# Patient Record
Sex: Male | Born: 1950 | Race: Black or African American | Hispanic: No | Marital: Married | Smoking: Never smoker
Health system: Southern US, Community
[De-identification: ages and names within clinical notes are randomized; demographics above are authoritative.]

## PROBLEM LIST (undated history)

## (undated) DIAGNOSIS — R011 Cardiac murmur, unspecified: Secondary | ICD-10-CM

## (undated) DIAGNOSIS — A159 Respiratory tuberculosis unspecified: Secondary | ICD-10-CM

## (undated) DIAGNOSIS — E119 Type 2 diabetes mellitus without complications: Secondary | ICD-10-CM

## (undated) DIAGNOSIS — I10 Essential (primary) hypertension: Secondary | ICD-10-CM

## (undated) HISTORY — DX: Respiratory tuberculosis unspecified: A15.9

## (undated) HISTORY — DX: Cardiac murmur, unspecified: R01.1

## (undated) HISTORY — DX: Type 2 diabetes mellitus without complications: E11.9

## (undated) HISTORY — DX: Essential (primary) hypertension: I10

---

## 1992-12-26 HISTORY — PX: HEMORRHOID SURGERY: SHX153

## 2002-07-24 ENCOUNTER — Emergency Department (HOSPITAL_COMMUNITY): Admission: EM | Admit: 2002-07-24 | Discharge: 2002-07-24 | Payer: Self-pay | Admitting: Emergency Medicine

## 2005-01-11 ENCOUNTER — Emergency Department (HOSPITAL_COMMUNITY): Admission: EM | Admit: 2005-01-11 | Discharge: 2005-01-11 | Payer: Self-pay | Admitting: Emergency Medicine

## 2005-01-15 ENCOUNTER — Emergency Department (HOSPITAL_COMMUNITY): Admission: EM | Admit: 2005-01-15 | Discharge: 2005-01-15 | Payer: Self-pay | Admitting: Emergency Medicine

## 2014-08-10 ENCOUNTER — Ambulatory Visit (INDEPENDENT_AMBULATORY_CARE_PROVIDER_SITE_OTHER): Payer: Non-veteran care | Admitting: Family Medicine

## 2014-08-10 VITALS — BP 162/82 | HR 78 | Temp 98.2°F | Resp 16 | Ht 67.0 in | Wt 211.2 lb

## 2014-08-10 DIAGNOSIS — H5711 Ocular pain, right eye: Secondary | ICD-10-CM

## 2014-08-10 DIAGNOSIS — S0502XA Injury of conjunctiva and corneal abrasion without foreign body, left eye, initial encounter: Secondary | ICD-10-CM

## 2014-08-10 DIAGNOSIS — H5710 Ocular pain, unspecified eye: Secondary | ICD-10-CM

## 2014-08-10 DIAGNOSIS — S058X9A Other injuries of unspecified eye and orbit, initial encounter: Secondary | ICD-10-CM

## 2014-08-10 NOTE — Progress Notes (Signed)
**Note Paul-Identified via Obfuscation**    Subjective:    Patient ID: Paul Clements, male    DOB: May 01, 1951, 63 y.o.   MRN: [id number] This chart was scribed for Paul SidleKurt Tiernan Suto, MD by Chestine SporeSoijett Blue, ED Scribe. The patient was seen in room 12 at 12:56 PM.   Chief Complaint  Patient presents with  . Eye Drainage    clear drainage, poked in eye with friends star earring, ensuring it is not infected    HPI  HPI Comments: Paul NurseRufus E Venable is a 63 y.o. male with a medical hx of HTN, DM, Tuberculosis, and heart murmur who presents today complaining of eye drainage onset last night. He states that the drainage is clear. He states that he was hugging a friend and her earring poked his eye. He states that he wants to have his eye checked out to make sure that it is not infected. He states that last night his eye was burning. He denies any other associated symptoms. He states that he put a wash-cloth on his eye with warm water running over it. He states that he works with the [employer]. He denies being allergic to any medications. He states that he drove himself here today.   There are no active problems to display for this patient.  Past Medical History  Diagnosis Date  . Diabetes mellitus without complication   . Heart murmur   . Hypertension   . Tuberculosis    Past Surgical History  Procedure Laterality Date  . Hemorrhoid surgery  1994   No Known Allergies Prior to Admission medications   Medication Sig Start Date End Date Taking? Authorizing Provider  aspirin 81 MG tablet Take 81 mg by mouth daily.   Yes Historical Provider, MD     Review of Systems  Eyes: Positive for discharge.       Objective:   Physical Exam  Nursing note and vitals reviewed. Constitutional: He is oriented to person, place, and time. He appears well-developed and well-nourished. No distress.  HENT:  Head: Normocephalic and atraumatic.  Eyes: EOM are normal.  Slit lamp exam:      The left eye shows corneal abrasion.  1/4 inch by 3/8 inch  corneal abrasion on the left eye.   Neck: Neck supple. No tracheal deviation present.  Cardiovascular: Normal rate.   Pulmonary/Chest: Effort normal. No respiratory distress.  Musculoskeletal: Normal range of motion.  Neurological: He is alert and oriented to person, place, and time.  Skin: Skin is warm and dry.  Psychiatric: He has a normal mood and affect. His behavior is normal.    Left eye injected laterally with full EOM, PERLA     BP 162/82  Pulse 78  Temp(Src) 98.2 F (36.8 C) (Oral)  Resp 16  Ht 5\' 7"  (1.702 m)  Wt 211 lb 3.2 oz (95.8 kg)  BMI 33.07 kg/m2  SpO2 100%  Assessment & Plan:  DIAGNOSTIC STUDIES: Oxygen Saturation is 100% on room air, normal by my interpretation.    COORDINATION OF CARE: 1:05 PM-Discussed treatment plan with pt at bedside and pt agreed to plan. Drops were given in the office for the pt; Tobrex and prednisolone.   1. Corneal abrasion, left, initial encounter     I personally performed the services described in this documentation, which was scribed in my presence. The recorded information has been reviewed and is accurate.  Corneal abrasion, left, initial encounter  Eye pain, right  Signed, Paul SidleKurt Reina Wilton, MD

## 2014-08-10 NOTE — Patient Instructions (Signed)
Corneal Abrasion °The cornea is the clear covering at the front and center of the eye. When looking at the colored portion of the eye (iris), you are looking through the cornea. This very thin tissue is made up of many layers. The surface layer is a single layer of cells (corneal epithelium) and is one of the most sensitive tissues in the body. If a scratch or injury causes the corneal epithelium to come off, it is called a corneal abrasion. If the injury extends to the tissues below the epithelium, the condition is called a corneal ulcer. °CAUSES  °· Scratches. °· Trauma. °· Foreign body in the eye. °Some people have recurrences of abrasions in the area of the original injury even after it has healed (recurrent erosion syndrome). Recurrent erosion syndrome generally improves and goes away with time. °SYMPTOMS  °· Eye pain. °· Difficulty or inability to keep the injured eye open. °· The eye becomes very sensitive to light. °· Recurrent erosions tend to happen suddenly, first thing in the morning, usually after waking up and opening the eye. °DIAGNOSIS  °Your health care provider can diagnose a corneal abrasion during an eye exam. Dye is usually placed in the eye using a drop or a small paper strip moistened by your tears. When the eye is examined with a special light, the abrasion shows up clearly because of the dye. °TREATMENT  °· Small abrasions may be treated with antibiotic drops or ointment alone. °· A pressure patch may be put over the eye. If this is done, follow your doctor's instructions for when to remove the patch. Do not drive or use machines while the eye patch is on. Judging distances is hard to do with a patch on. °If the abrasion becomes infected and spreads to the deeper tissues of the cornea, a corneal ulcer can result. This is serious because it can cause corneal scarring. Corneal scars interfere with light passing through the cornea and cause a loss of vision in the involved eye. °HOME CARE  INSTRUCTIONS °· Use medicine or ointment as directed. Only take over-the-counter or prescription medicines for pain, discomfort, or fever as directed by your health care provider. °· Do not drive or operate machinery if your eye is patched. Your ability to judge distances is impaired. °· If your health care provider has given you a follow-up appointment, it is very important to keep that appointment. Not keeping the appointment could result in a severe eye infection or permanent loss of vision. If there is any problem keeping the appointment, let your health care provider know. °SEEK MEDICAL CARE IF:  °· You have pain, light sensitivity, and a scratchy feeling in one eye or both eyes. °· Your pressure patch keeps loosening up, and you can blink your eye under the patch after treatment. °· Any kind of discharge develops from the eye after treatment or if the lids stick together in the morning. °· You have the same symptoms in the morning as you did with the original abrasion days, weeks, or months after the abrasion healed. °MAKE SURE YOU:  °· Understand these instructions. °· Will watch your condition. °· Will get help right away if you are not doing well or get worse. °Document Released: 12/09/2000 Document Revised: 12/17/2013 Document Reviewed: 08/19/2013 °ExitCare® Patient Information ©2015 [employer]. This information is not intended to replace advice given to you by your health care provider. Make sure you discuss any questions you have with your health care provider. ° °

## 2014-11-21 ENCOUNTER — Ambulatory Visit (INDEPENDENT_AMBULATORY_CARE_PROVIDER_SITE_OTHER): Payer: Non-veteran care | Admitting: Emergency Medicine

## 2014-11-21 VITALS — BP 162/86 | HR 70 | Temp 98.0°F | Resp 18 | Ht 67.5 in | Wt 211.2 lb

## 2014-11-21 DIAGNOSIS — R1084 Generalized abdominal pain: Secondary | ICD-10-CM

## 2014-11-21 LAB — POCT CBC
Granulocyte percent: 70.4 %G (ref 37–80)
HCT, POC: 38 % — AB (ref 43.5–53.7)
Hemoglobin: 11.3 g/dL — AB (ref 14.1–18.1)
LYMPH, POC: 1.9 (ref 0.6–3.4)
MCH, POC: 23 pg — AB (ref 27–31.2)
MCHC: 29.7 g/dL — AB (ref 31.8–35.4)
MCV: 77.3 fL — AB (ref 80–97)
MID (cbc): 0.1 (ref 0–0.9)
MPV: 8.6 fL (ref 0–99.8)
PLATELET COUNT, POC: 149 10*3/uL (ref 142–424)
POC Granulocyte: 4.7 (ref 2–6.9)
POC LYMPH PERCENT: 27.7 %L (ref 10–50)
POC MID %: 1.9 % (ref 0–12)
RBC: 4.92 M/uL (ref 4.69–6.13)
RDW, POC: 15.1 %
WBC: 6.7 10*3/uL (ref 4.6–10.2)

## 2014-11-21 MED ORDER — ONDANSETRON 8 MG PO TBDP: 8.0000 mg | ORAL_TABLET | Freq: Three times a day (TID) | ORAL | Status: DC | PRN

## 2014-11-21 MED ORDER — LOPERAMIDE HCL 2 MG PO TABS: ORAL_TABLET | ORAL | Status: DC

## 2014-11-21 NOTE — Progress Notes (Signed)
**Note Paul-Identified via Obfuscation** [facility] [address], [place] [id number] [phone]  Date:  11/21/2014   Name:  Paul NurseRufus E Caradine   DOB:  1951/04/21   MRN:  [id number]  PCP:  No PCP Per Patient    Chief Complaint: Abdominal Pain and Nausea   History of Present Illness:  Paul Clements is a 63 y.o. very pleasant male patient who presents with the following:  Ate thanksgiving dinner out of town yesterday.  Since arising today has experienced crampy abdominal pain and frequent stools No diarrhea.  No vomiting but has diaphoresis and nausea at times with bowel movement. No fever or chills.  The patient has no complaint of blood, mucous, or pus in her stools. Says has generalized abdominal discomfort. No GU symptoms No ill contacts No improvement with over the counter medications or other home remedies.  Denies other complaint or health concern today.   There are no active problems to display for this patient.   Past Medical History  Diagnosis Date  . Diabetes mellitus without complication   . Heart murmur   . Hypertension   . Tuberculosis     Past Surgical History  Procedure Laterality Date  . Hemorrhoid surgery  1994    History  Substance Use Topics  . Smoking status: Never Smoker   . Smokeless tobacco: Never Used  . Alcohol Use: Yes    Family History  Problem Relation Age of Onset  . Diabetes Mother   . Diabetes Brother   . Hypertension Brother     No Known Allergies  Medication list has been reviewed and updated.  Current Outpatient Prescriptions on File Prior to Visit  Medication Sig Dispense Refill  . aspirin 81 MG tablet Take 81 mg by mouth daily.     No current facility-administered medications on file prior to visit.    Review of Systems:  As per HPI, otherwise negative.    Physical Examination: Filed Vitals:   11/21/14 1742  BP: 162/86  Pulse: 70  Temp: 98 F (36.7 C)  Resp: 18   Filed Vitals:   11/21/14 1742  Height: 5' 7.5" (1.715 m)   Weight: 211 lb 3.2 oz (95.8 kg)   Body mass index is 32.57 kg/(m^2). Ideal Body Weight: Weight in (lb) to have BMI = 25: 161.7  GEN: WDWN, NAD, Non-toxic, A & O x 3 HEENT: Atraumatic, Normocephalic. Neck supple. No masses, No LAD. Ears and Nose: No external deformity. CV: RRR, No M/G/R. No JVD. No thrill. No extra heart sounds. PULM: CTA B, no wheezes, crackles, rhonchi. No retractions. No resp. distress. No accessory muscle use. ABD: S, NT, ND, +BS. No rebound. No HSM. EXTR: No c/c/e NEURO Normal gait.  PSYCH: Normally interactive. Conversant. Not depressed or anxious appearing.  Calm demeanor.    Assessment and Plan: Colicky abdominal pain Anemia Hemoccult Follow up with FMD   Signed,  Phillips OdorJeffery Kirstine Jacquin, MD   Results for orders placed or performed in visit on 11/21/14  POCT CBC  Result Value Ref Range   WBC 6.7 4.6 - 10.2 K/uL   Lymph, poc 1.9 0.6 - 3.4   POC LYMPH PERCENT 27.7 10 - 50 %L   MID (cbc) 0.1 0 - 0.9   POC MID % 1.9 0 - 12 %M   POC Granulocyte 4.7 2 - 6.9   Granulocyte percent 70.4 37 - 80 %G   RBC 4.92 4.69 - 6.13 M/uL   Hemoglobin 11.3 (A) 14.1 - 18.1 g/dL  HCT, POC 38.0 (A) 43.5 - 53.7 %   MCV 77.3 (A) 80 - 97 fL   MCH, POC 23.0 (A) 27 - 31.2 pg   MCHC 29.7 (A) 31.8 - 35.4 g/dL   RDW, POC 65.715.1 %   Platelet Count, POC 149 142 - 424 K/uL   MPV 8.6 0 - 99.8 fL

## 2018-03-15 ENCOUNTER — Ambulatory Visit (HOSPITAL_COMMUNITY)
Admission: EM | Admit: 2018-03-15 | Discharge: 2018-03-15 | Disposition: A | Discharge status: Home / Self Care | Payer: Self-pay | Attending: Family Medicine | Admitting: Family Medicine

## 2018-03-15 ENCOUNTER — Encounter (HOSPITAL_COMMUNITY): Payer: Self-pay | Admitting: Emergency Medicine

## 2018-03-15 ENCOUNTER — Other Ambulatory Visit: Payer: Self-pay

## 2018-03-15 DIAGNOSIS — M79605 Pain in left leg: Secondary | ICD-10-CM

## 2018-03-15 MED ORDER — DICLOFENAC SODIUM 75 MG PO TBEC: 75.0000 mg | DELAYED_RELEASE_TABLET | Freq: Two times a day (BID) | ORAL | 0 refills | Status: DC

## 2018-03-15 NOTE — ED Triage Notes (Signed)
C/o left leg pain, no known injury

## 2018-03-15 NOTE — ED Notes (Signed)
Patient discharged by provider.

## 2018-03-19 NOTE — ED Provider Notes (Signed)
[facility]   [id number] 03/15/18 Arrival Time: 1702  ASSESSMENT & PLAN:  1. Left leg pain    Meds ordered this encounter  Medications  . diclofenac (VOLTAREN) 75 MG EC tablet    Sig: Take 1 tablet (75 mg total) by mouth 2 (two) times daily.    Dispense:  14 tablet    Refill:  0   Trial of NSAID. [facility]  Reviewed expectations re: course of current medical issues. Questions answered. Outlined signs and symptoms indicating need for more acute intervention. Patient verbalized understanding. After Visit Summary given.  SUBJECTIVE: History from: patient. Paul Clements is a 67 y.o. male who reports generalized LLE discomfort. On/off for a few days. No injury/trauma. Ambulatory without difficulty. When present, describes as an "ache... or soreness." No OTC analgesics needed. May last a few minutes, sometimes up to an hour. No extremity sensation changes or weakness. No specific aggravating or alleviating factors reported. No h/o similar.  ROS: As per HPI.   OBJECTIVE:  Vitals:   03/15/18 1727  BP: 140/62  Pulse: 66  Temp: 97.9 F (36.6 C)  TempSrc: Oral  SpO2: 100%    General appearance: alert; no distress Extremities: no cyanosis or edema; symmetrical with no gross deformities; LLE exam completely normal CV: normal extremity capillary refill Skin: warm and dry Neurologic: normal gait; normal symmetric reflexes in all extremities; normal sensation in all extremities Psychological: alert and cooperative; normal mood and affect  No Known Allergies  Past Medical History:  Diagnosis Date  . Diabetes mellitus without complication (HCC)   . Heart murmur   . Hypertension   . Tuberculosis    Social History   Socioeconomic History  . Marital status: Married    Spouse name: Not on file  . Number of children: Not on file  . Years of education: Not on file  . Highest education level: Not on file  Occupational History  . Not on file    Social Needs  . Financial resource strain: Not on file  . Food insecurity:    Worry: Not on file    Inability: Not on file  . Transportation needs:    Medical: Not on file    Non-medical: Not on file  Tobacco Use  . Smoking status: Never Smoker  . Smokeless tobacco: Never Used  Substance and Sexual Activity  . Alcohol use: Yes  . Drug use: No  . Sexual activity: Not on file  Lifestyle  . Physical activity:    Days per week: Not on file    Minutes per session: Not on file  . Stress: Not on file  Relationships  . Social connections:    Talks on phone: Not on file    Gets together: Not on file    Attends religious service: Not on file    Active member of club or organization: Not on file    Attends meetings of clubs or organizations: Not on file    Relationship status: Not on file  . Intimate partner violence:    Fear of current or ex partner: Not on file    Emotionally abused: Not on file    Physically abused: Not on file    Forced sexual activity: Not on file  Other Topics Concern  . Not on file  Social History Narrative  . Not on file   Family History  Problem Relation Age of Onset  . Diabetes Mother   . Diabetes  Brother   . Hypertension Brother    Past Surgical History:  Procedure Laterality Date  . HEMORRHOID SURGERY  1994      Mardella LaymanHagler, Reynalda Canny, MD 03/20/18 1005

## 2020-04-12 ENCOUNTER — Emergency Department (HOSPITAL_COMMUNITY): Payer: Medicare Other

## 2020-04-12 ENCOUNTER — Encounter (HOSPITAL_COMMUNITY): Payer: Self-pay | Admitting: Emergency Medicine

## 2020-04-12 ENCOUNTER — Emergency Department (HOSPITAL_COMMUNITY)
Admission: EM | Admit: 2020-04-12 | Discharge: 2020-04-12 | Disposition: A | Discharge status: Home / Self Care | Payer: Medicare Other | Attending: Emergency Medicine | Admitting: Emergency Medicine

## 2020-04-12 ENCOUNTER — Other Ambulatory Visit: Payer: Self-pay

## 2020-04-12 DIAGNOSIS — I10 Essential (primary) hypertension: Secondary | ICD-10-CM | POA: Diagnosis not present

## 2020-04-12 DIAGNOSIS — Z7982 Long term (current) use of aspirin: Secondary | ICD-10-CM | POA: Insufficient documentation

## 2020-04-12 DIAGNOSIS — K8020 Calculus of gallbladder without cholecystitis without obstruction: Secondary | ICD-10-CM | POA: Insufficient documentation

## 2020-04-12 DIAGNOSIS — E119 Type 2 diabetes mellitus without complications: Secondary | ICD-10-CM | POA: Diagnosis not present

## 2020-04-12 DIAGNOSIS — R109 Unspecified abdominal pain: Secondary | ICD-10-CM | POA: Diagnosis present

## 2020-04-12 DIAGNOSIS — Z79899 Other long term (current) drug therapy: Secondary | ICD-10-CM | POA: Insufficient documentation

## 2020-04-12 LAB — CBC WITH DIFFERENTIAL/PLATELET
Abs Immature Granulocytes: 0.03 10*3/uL (ref 0.00–0.07)
Basophils Absolute: 0 10*3/uL (ref 0.0–0.1)
Basophils Relative: 0 %
Eosinophils Absolute: 0 10*3/uL (ref 0.0–0.5)
Eosinophils Relative: 0 %
HCT: 46 % (ref 39.0–52.0)
Hemoglobin: 13.9 g/dL (ref 13.0–17.0)
Immature Granulocytes: 0 %
Lymphocytes Relative: 14 %
Lymphs Abs: 1.3 10*3/uL (ref 0.7–4.0)
MCH: 24 pg — ABNORMAL LOW (ref 26.0–34.0)
MCHC: 30.2 g/dL (ref 30.0–36.0)
MCV: 79.3 fL — ABNORMAL LOW (ref 80.0–100.0)
Monocytes Absolute: 0.2 10*3/uL (ref 0.1–1.0)
Monocytes Relative: 2 %
Neutro Abs: 7.5 10*3/uL (ref 1.7–7.7)
Neutrophils Relative %: 84 %
Platelets: 160 10*3/uL (ref 150–400)
RBC: 5.8 MIL/uL (ref 4.22–5.81)
RDW: 14.4 % (ref 11.5–15.5)
WBC: 9.1 10*3/uL (ref 4.0–10.5)
nRBC: 0 % (ref 0.0–0.2)

## 2020-04-12 LAB — COMPREHENSIVE METABOLIC PANEL
ALT: 15 U/L (ref 0–44)
AST: 24 U/L (ref 15–41)
Albumin: 4.3 g/dL (ref 3.5–5.0)
Alkaline Phosphatase: 77 U/L (ref 38–126)
Anion gap: 11 (ref 5–15)
BUN: 13 mg/dL (ref 8–23)
CO2: 24 mmol/L (ref 22–32)
Calcium: 9.4 mg/dL (ref 8.9–10.3)
Chloride: 101 mmol/L (ref 98–111)
Creatinine, Ser: 0.98 mg/dL (ref 0.61–1.24)
GFR calc Af Amer: >60 mL/min (ref >60)
GFR calc non Af Amer: >60 mL/min (ref >60)
Glucose, Bld: 254 mg/dL — ABNORMAL HIGH (ref 70–99)
Potassium: 4.4 mmol/L (ref 3.5–5.1)
Sodium: 136 mmol/L (ref 135–145)
Total Bilirubin: 1.4 mg/dL — ABNORMAL HIGH (ref 0.3–1.2)
Total Protein: 8.7 g/dL — ABNORMAL HIGH (ref 6.5–8.1)

## 2020-04-12 LAB — URINALYSIS, ROUTINE W REFLEX MICROSCOPIC
Bacteria, UA: NONE SEEN
Bilirubin Urine: NEGATIVE
Glucose, UA: >=500 mg/dL — AB
Hgb urine dipstick: NEGATIVE
Ketones, ur: 20 mg/dL — AB
Leukocytes,Ua: NEGATIVE
Nitrite: NEGATIVE
Protein, ur: 100 mg/dL — AB
Specific Gravity, Urine: 1.016 (ref 1.005–1.030)
pH: 7 (ref 5.0–8.0)

## 2020-04-12 LAB — LIPASE, BLOOD: Lipase: 29 U/L (ref 11–51)

## 2020-04-12 MED ORDER — ONDANSETRON 4 MG PO TBDP: 4.0000 mg | ORAL_TABLET | Freq: Three times a day (TID) | ORAL | 0 refills | Status: AC | PRN

## 2020-04-12 MED ORDER — ONDANSETRON 4 MG PO TBDP
4.0000 mg | ORAL_TABLET | Freq: Once | ORAL | Status: AC
  Administered 2020-04-12: 4 mg via ORAL
  Filled 2020-04-12: qty 1

## 2020-04-12 MED ORDER — OXYCODONE-ACETAMINOPHEN 5-325 MG PO TABS: 1.0000 | ORAL_TABLET | ORAL | 0 refills | Status: AC | PRN

## 2020-04-12 MED ORDER — HYDROMORPHONE HCL 1 MG/ML IJ SOLN
1.0000 mg | Freq: Once | INTRAMUSCULAR | Status: AC
  Administered 2020-04-12: 1 mg via INTRAVENOUS
  Filled 2020-04-12: qty 1

## 2020-04-12 MED ORDER — SODIUM CHLORIDE 0.9 % IV BOLUS
500.0000 mL | Freq: Once | INTRAVENOUS | Status: AC
  Administered 2020-04-12: 500 mL via INTRAVENOUS

## 2020-04-12 MED ORDER — FENTANYL CITRATE (PF) 100 MCG/2ML IJ SOLN
50.0000 ug | Freq: Once | INTRAMUSCULAR | Status: AC
  Administered 2020-04-12: 50 ug via INTRAVENOUS
  Filled 2020-04-12: qty 2

## 2020-04-12 MED ORDER — OXYCODONE-ACETAMINOPHEN 5-325 MG PO TABS
1.0000 | ORAL_TABLET | Freq: Once | ORAL | Status: AC
  Administered 2020-04-12: 1 via ORAL
  Filled 2020-04-12: qty 1

## 2020-04-12 NOTE — ED Provider Notes (Signed)
[facility] DEPT Provider Note   CSN: [id number] Arrival date & time: 04/12/20  1638     History Chief Complaint  Patient presents with  . Flank Pain    Paul Clements is a 69 y.o. male history of obesity, diabetes, hypertension, tuberculosis.  Patient presents today for right-sided flank pain onset 3 days ago, no clear inciting event.  Describes a waxing and waning throbbing sensation to his right side, no clear aggravating or alleviating factors, currently moderate in intensity.  Denies similar pain in the past.  Denies fever/chills, fall/injury, headache, chest pain/shortness of breath, pleurisy, nausea/vomiting, diarrhea, dysuria/hematuria, testicular pain/swelling, numbness/tingling, weakness or any additional concerns. HPI     Past Medical History:  Diagnosis Date  . Diabetes mellitus without complication ([place])   . Heart murmur   . Hypertension   . Tuberculosis     There are no problems to display for this patient.   Past Surgical History:  Procedure Laterality Date  . HEMORRHOID SURGERY  1994       Family History  Problem Relation Age of Onset  . Diabetes Mother   . Diabetes Brother   . Hypertension Brother     Social History   Tobacco Use  . Smoking status: Never Smoker  . Smokeless tobacco: Never Used  Substance Use Topics  . Alcohol use: Yes  . Drug use: No    Home Medications Prior to Admission medications   Medication Sig Start Date End Date Taking? Authorizing Provider  aspirin 81 MG tablet Take 81 mg by mouth daily.    [provider]  atorvastatin (LIPITOR) 40 MG tablet Take 40 mg by mouth daily.    [provider]  carvedilol (COREG) 12.5 MG tablet Take 12.5 mg by mouth 2 (two) times daily with a meal.    [provider]  diclofenac (VOLTAREN) 75 MG EC tablet Take 1 tablet (75 mg total) by mouth 2 (two) times daily. 03/15/18   Vanessa Kick, MD    Allergies    Patient has no known  allergies.  Review of Systems   Review of Systems Ten systems are reviewed and are negative for acute change except as noted in the HPI  Physical Exam Updated Vital Signs There were no vitals taken for this visit.  Physical Exam Constitutional:      General: He is not in acute distress.    Appearance: Normal appearance. He is well-developed. He is obese. He is not ill-appearing or diaphoretic.  HENT:     Head: Normocephalic and atraumatic.     Right Ear: External ear normal.     Left Ear: External ear normal.     Nose: Nose normal.  Eyes:     General: Vision grossly intact. Gaze aligned appropriately.     Pupils: Pupils are equal, round, and reactive to light.  Neck:     Trachea: Trachea and phonation normal. No tracheal deviation.  Pulmonary:     Effort: Pulmonary effort is normal. No respiratory distress.  Abdominal:     General: There is no distension.     Palpations: Abdomen is soft.     Tenderness: There is no abdominal tenderness. There is right CVA tenderness. There is no left CVA tenderness, guarding or rebound.  Musculoskeletal:        General: Normal range of motion.     Cervical back: Normal range of motion.     Comments: No midline C/T/L spinal tenderness to palpation, no deformity, crepitus,  or step-off noted.  Skin:    General: Skin is warm and dry.  Neurological:     Mental Status: He is alert.     GCS: GCS eye subscore is 4. GCS verbal subscore is 5. GCS motor subscore is 6.     Comments: Speech is clear and goal oriented, follows commands Major Cranial nerves without deficit, no facial droop Moves extremities without ataxia, coordination intact  Psychiatric:        Behavior: Behavior normal.    ED Results / Procedures / Treatments   Labs (all labs ordered are listed, but only abnormal results are displayed) Labs Reviewed - No data to display  EKG None  Radiology No results found.  Procedures Procedures (including critical care  time)  Medications Ordered in ED Medications - No data to display  ED Course  I have reviewed the triage vital signs and the nursing notes.  Pertinent labs & imaging results that were available during my care of the patient were reviewed by me and considered in my medical decision making (see chart for details).    MDM Rules/Calculators/A&P                     I have ordered reviewed and interpreted the following labs.  CBC shows no leukocytosis to suggest infection and no evidence of anemia.  Lipase within normal limits not suggestive of pancreatitis.  CMP shows elevation of glucose, total bilirubin 1.4, no emergent electrolyte derangement, evidence of kidney injury or acute elevation of LFTs.  Urinalysis shows 500 glucose, 20 ketones, 100 protein, 11-20 RBCs.  Appears patient dehydrated, does not appear to be in DKA at this point, normal gap and CO2.  CT renal stone study:  IMPRESSION:  1. No acute abnormality.  2. Cholelithiasis.  3. Small hiatal hernia.  4. Small lower pole right renal cyst and additional small probable  mildly complicated cyst in the lower pole of each kidney. These  cannot be adequately characterized without intravenous contrast.  Consider further evaluation with an elective outpatient pre and  postcontrast magnetic resonance imaging examination of the kidneys.  5. 4 mm subpleural nodule in the right middle lobe. No follow-up  needed if patient is low-risk. Non-contrast chest CT can be  considered in 12 months if patient is high-risk. This recommendation  follows the consensus statement: Guidelines for Management of  Incidental Pulmonary Nodules Detected on CT Images: From the  Fleischner Society 2017; Radiology 2017; 284:228-243.   Patient reevaluated no right upper quadrant pain on exam and negative Murphy sign.  After initial 50 mcg fentanyl patient endorsed continued pain will give additional 1 mg Dilaudid ordered as well as IV fluid for dehydration.   Patient seen and evaluated by Dr. Pilar Plate, will obtain right upper quadrant ultrasound for further evaluation of gallstones. - Care handoff given to Dr. Pilar Plate at shift change.  Note: Portions of this report may have been transcribed using voice recognition software. Every effort was made to ensure accuracy; however, inadvertent computerized transcription errors may still be present. Final Clinical Impression(s) / ED Diagnoses Final diagnoses:  None    Rx / DC Orders ED Discharge Orders    None       Elizabeth Palau 04/12/20 2053    Sabas Sous, MD 04/14/20 [phone]

## 2020-04-12 NOTE — ED Notes (Signed)
Pt reporting nausea after IV access was removed. Melvenia Beam, PA was notified and order for Zofran 4mg  ODT placed.

## 2020-04-12 NOTE — ED Notes (Signed)
To CT

## 2020-04-12 NOTE — ED Notes (Signed)
[place] at bedside at this time. Pt reporting increase of pain Dr.Bero notified.

## 2020-04-12 NOTE — ED Triage Notes (Signed)
Patient here from home with complaints of right sided flank pain that started today. No hx of stones.

## 2020-04-12 NOTE — ED Notes (Signed)
CBC recollect sent to main lab by this writer at 1836 hrs

## 2020-04-12 NOTE — Discharge Instructions (Addendum)
Follow up with your doctor at the [facility] for referral to general surgery to consider elective gall bladder removal.   As discussed, your blood sugar is elevated tonight at 254. Please discuss this with your doctor as well to determine if you need to resume treatment for diabetes.   Return to the emergency department with any new or worsening symptoms - uncontrolled pain or vomiting, high fever, or new concern.

## 2020-04-12 NOTE — ED Provider Notes (Signed)
Right flank pain - no stones CT did show gall stones Pending [place]  10:30 - US shows cholelithiasis without evidence of cholecystitis.   Pain has returned. No RUQ tenderness, he reports pain is in the back. Will transition to PO medications and reassess.   11:30 - pain is controlled with Percocet. VSS.   Discussed his elevated CBG of 254. He states he was on medications in the past but was able to come off of them because of lifestyle changes. He receives his medical care through the [place] and states he had a routine exam 2 months ago and labs were normal. He is encouraged to speak to his doctor about his elevated CBG for ongoing care. [facility] as well.    Elpidio Anis, PA-C 04/12/20 2330    Sabas Sous, MD 04/14/20 [phone]

## 2020-04-12 NOTE — ED Notes (Signed)
Amada Jupiter (daughter) [phone]

## 2020-06-12 ENCOUNTER — Emergency Department (HOSPITAL_COMMUNITY)
Admission: EM | Admit: 2020-06-12 | Discharge: 2020-06-12 | Disposition: A | Discharge status: Home / Self Care | Payer: No Typology Code available for payment source | Attending: Emergency Medicine | Admitting: Emergency Medicine

## 2020-06-12 ENCOUNTER — Emergency Department (HOSPITAL_COMMUNITY): Payer: No Typology Code available for payment source

## 2020-06-12 DIAGNOSIS — Z7982 Long term (current) use of aspirin: Secondary | ICD-10-CM | POA: Insufficient documentation

## 2020-06-12 DIAGNOSIS — E119 Type 2 diabetes mellitus without complications: Secondary | ICD-10-CM | POA: Insufficient documentation

## 2020-06-12 DIAGNOSIS — R55 Syncope and collapse: Secondary | ICD-10-CM | POA: Diagnosis present

## 2020-06-12 DIAGNOSIS — Z7984 Long term (current) use of oral hypoglycemic drugs: Secondary | ICD-10-CM | POA: Diagnosis not present

## 2020-06-12 DIAGNOSIS — Z79899 Other long term (current) drug therapy: Secondary | ICD-10-CM | POA: Insufficient documentation

## 2020-06-12 DIAGNOSIS — I10 Essential (primary) hypertension: Secondary | ICD-10-CM | POA: Diagnosis not present

## 2020-06-12 LAB — CBC WITH DIFFERENTIAL/PLATELET
Abs Immature Granulocytes: 0.04 10*3/uL (ref 0.00–0.07)
Basophils Absolute: 0 10*3/uL (ref 0.0–0.1)
Basophils Relative: 0 %
Eosinophils Absolute: 0.6 10*3/uL — ABNORMAL HIGH (ref 0.0–0.5)
Eosinophils Relative: 6 %
HCT: 33.1 % — ABNORMAL LOW (ref 39.0–52.0)
Hemoglobin: 10.1 g/dL — ABNORMAL LOW (ref 13.0–17.0)
Immature Granulocytes: 0 %
Lymphocytes Relative: 22 %
Lymphs Abs: 2.1 10*3/uL (ref 0.7–4.0)
MCH: 24.2 pg — ABNORMAL LOW (ref 26.0–34.0)
MCHC: 30.5 g/dL (ref 30.0–36.0)
MCV: 79.2 fL — ABNORMAL LOW (ref 80.0–100.0)
Monocytes Absolute: 0.6 10*3/uL (ref 0.1–1.0)
Monocytes Relative: 6 %
Neutro Abs: 6.2 10*3/uL (ref 1.7–7.7)
Neutrophils Relative %: 66 %
Platelets: 193 10*3/uL (ref 150–400)
RBC: 4.18 MIL/uL — ABNORMAL LOW (ref 4.22–5.81)
RDW: 14.5 % (ref 11.5–15.5)
WBC: 9.4 10*3/uL (ref 4.0–10.5)
nRBC: 0 % (ref 0.0–0.2)

## 2020-06-12 LAB — BASIC METABOLIC PANEL
Anion gap: 9 (ref 5–15)
BUN: 18 mg/dL (ref 8–23)
CO2: 24 mmol/L (ref 22–32)
Calcium: 9.2 mg/dL (ref 8.9–10.3)
Chloride: 102 mmol/L (ref 98–111)
Creatinine, Ser: 1.25 mg/dL — ABNORMAL HIGH (ref 0.61–1.24)
GFR calc Af Amer: >60 mL/min (ref >60)
GFR calc non Af Amer: 60 mL/min — ABNORMAL LOW (ref >60)
Glucose, Bld: 135 mg/dL — ABNORMAL HIGH (ref 70–99)
Potassium: 5.3 mmol/L — ABNORMAL HIGH (ref 3.5–5.1)
Sodium: 135 mmol/L (ref 135–145)

## 2020-06-12 MED ORDER — SODIUM CHLORIDE 0.9 % IV BOLUS
1000.0000 mL | Freq: Once | INTRAVENOUS | Status: AC
  Administered 2020-06-12: 1000 mL via INTRAVENOUS

## 2020-06-12 NOTE — ED Provider Notes (Signed)
[facility] EMERGENCY DEPARTMENT Provider Note   CSN: [id number] Arrival date & time: 06/12/20  1840     History Chief Complaint  Patient presents with  . Near Syncope    Paul Clements is a 69 y.o. male.  HPI 69 year old male presents with near syncope.  Was at the barber shop and while in the chair he all of a sudden started to feel nauseous and lightheaded.  He was getting hot.  Eventually stood up and walked around and then got back into the chair.  When he was leaving, he bent down to get his wallet which dropped to the ground and he had some hard time picking it up and felt worse.  When he got outside he lowered himself to the ground but does not think he actually passed out.  EMS is no longer present but according to chart review they noted that he had a BM when he syncopized and had some PVCs on his cardiac monitoring.  No headache, chest pain, shortness of breath or recent illness.   Past Medical History:  Diagnosis Date  . Diabetes mellitus without complication ([place])   . Heart murmur   . Hypertension   . Tuberculosis     There are no problems to display for this patient.   Past Surgical History:  Procedure Laterality Date  . HEMORRHOID SURGERY  1994       Family History  Problem Relation Age of Onset  . Diabetes Mother   . Diabetes Brother   . Hypertension Brother     Social History   Tobacco Use  . Smoking status: Never Smoker  . Smokeless tobacco: Never Used  Substance Use Topics  . Alcohol use: Yes  . Drug use: No    Home Medications Prior to Admission medications   Medication Sig Start Date End Date Taking? Authorizing Provider  amLODipine (NORVASC) 10 MG tablet Take 10 mg by mouth daily.   Yes [provider]  aspirin (ASPIRIN 81) 81 MG chewable tablet Chew 81 mg by mouth daily.   Yes [provider]  carvedilol (COREG) 25 MG tablet Take 12.5 mg by mouth 2 (two) times daily with a meal.   Yes [provider]  metFORMIN (GLUCOPHAGE) 500 MG tablet Take 1,000 mg by mouth 2 (two) times daily with a meal.   Yes [provider]  Vitamin D, Ergocalciferol, (DRISDOL) 1.25 MG (50000 UNIT) CAPS capsule Take 50,000 Units by mouth every 7 (seven) days.   Yes [provider]  lisinopril (ZESTRIL) 20 MG tablet Take 20 mg by mouth daily.  06/12/20 Yes [provider]  ondansetron (ZOFRAN ODT) 4 MG disintegrating tablet Take 1 tablet (4 mg total) by mouth every 8 (eight) hours as needed for nausea or vomiting. Patient not taking: Reported on 06/12/2020 04/12/20   Charlann Lange, PA-C  oxyCODONE-acetaminophen (PERCOCET/ROXICET) 5-325 MG tablet Take 1 tablet by mouth every 4 (four) hours as needed for severe pain. Patient not taking: Reported on 06/12/2020 04/12/20   Charlann Lange, PA-C    Allergies    Patient has no known allergies.  Review of Systems   Review of Systems  Physical Exam Updated Vital Signs BP 123/66   Pulse 67   Temp 98 F (36.7 C) (Oral)   Resp 17   SpO2 99%   Physical Exam  ED Results / Procedures / Treatments   Labs (all labs ordered are listed, but only abnormal results are displayed) Labs Reviewed  BASIC METABOLIC PANEL - Abnormal; Notable for the following components:      Result Value   Potassium 5.3 (*)    Glucose, Bld 135 (*)    Creatinine, Ser 1.25 (*)    GFR calc non Af Amer 60 (*)    All other components within normal limits  CBC WITH DIFFERENTIAL/PLATELET - Abnormal; Notable for the following components:   RBC 4.18 (*)    Hemoglobin 10.1 (*)    HCT 33.1 (*)    MCV 79.2 (*)    MCH 24.2 (*)    Eosinophils Absolute 0.6 (*)    All other components within normal limits    EKG EKG Interpretation  Date/Time:  Friday June 12 2020 19:59:33 EDT Ventricular Rate:  71 PR Interval:    QRS Duration: 103 QT Interval:  397 QTC Calculation: 432 R Axis:   -4 Text Interpretation: Sinus rhythm Probable left ventricular hypertrophy  Anterior Q waves, possibly due to LVH Nonspecific T abnormalities, inferior leads No old tracing to compare Confirmed by Pricilla Loveless [phone]) on 06/12/2020 8:02:24 PM   Radiology DG Chest 2 View  Result Date: 06/12/2020 CLINICAL DATA:  Syncope EXAM: CHEST - 2 VIEW COMPARISON:  None. FINDINGS: No consolidation, features of edema, pneumothorax, or effusion. Pulmonary vascularity is normally distributed. Coronary artery calcifications. The cardiomediastinal contours are unremarkable. No acute osseous or soft tissue abnormality. Degenerative changes are present in the imaged spine and shoulders. IMPRESSION: No acute cardiopulmonary abnormality. Electronically Signed   By: Kreg Shropshire M.D.   On: 06/12/2020 19:49    Procedures Procedures (including critical care time)  Medications Ordered in ED Medications  sodium chloride 0.9 % bolus 1,000 mL (1,000 mLs Intravenous New Bag/Given 06/12/20 2201)    ED Course  I have reviewed the triage vital signs and the nursing notes.  Pertinent labs & imaging results that were available during my care of the patient were reviewed by me and considered in my medical decision making (see chart for details).    MDM Rules/Calculators/A&P                          Patient's labs show mild hyperkalemia and mild AKI.  He was given IV fluid bolus.  His vital signs have remained stable.  His ECG is without acute ischemia or arrhythmia.  While EMS reported some PVCs, this is not seen when I reviewed cardiac monitoring.  At this point, I think with no clear cause of his near syncope, especially starting while sitting, I recommended he be admitted for arrhythmia/syncope work-up.  He seems understand my concern but does not want to stay.  He states he will follow up closely with his PCP.  I think this is reasonable but discussed strict return precautions.  He is noted to have a potassium of 5.3 and is on lisinopril so I will have him stop this. Final Clinical Impression(s)  / ED Diagnoses Final diagnoses:  Near syncope    Rx / DC Orders ED Discharge Orders    None       Pricilla Loveless, MD 06/12/20 2321

## 2020-06-12 NOTE — ED Triage Notes (Signed)
Pt from barbershop; ems called out for witnessed syncopal episode; pt was sitting in chair, started feeling anxious, hot; went to leave, dropped wallet, picked it up, got outside and fell; pt lowered self to ground; pt had BM when he synopsized; on ems arrival, pt extremely diaphoretic; alert and oriented x 4 on ems arrival; pt states last time he felt like this the "bp had skyrocketed"; CBG 150. Hx DM; 12 lead sinus with PVCs; hx cardiomegaly; pt also endorses similar syncopal episode during stress test  130/64 HR 66-72 RR 16 CBG 150

## 2020-06-12 NOTE — Discharge Instructions (Addendum)
Your potassium is mildly elevated today.  You need to stop taking LISINOPRIL. Call your doctor for close outpatient follow up and medication changes next week.

## 2020-12-06 IMAGING — US US ABDOMEN LIMITED
1 series · 14 of 25 positions shown · non-contrast
Comparison: None.

CLINICAL DATA: Acute flank pain

EXAM:
ULTRASOUND ABDOMEN LIMITED RIGHT UPPER QUADRANT

[Series 1: us abdomen limited · 14 of 75 slices shown]
[im 1/75]
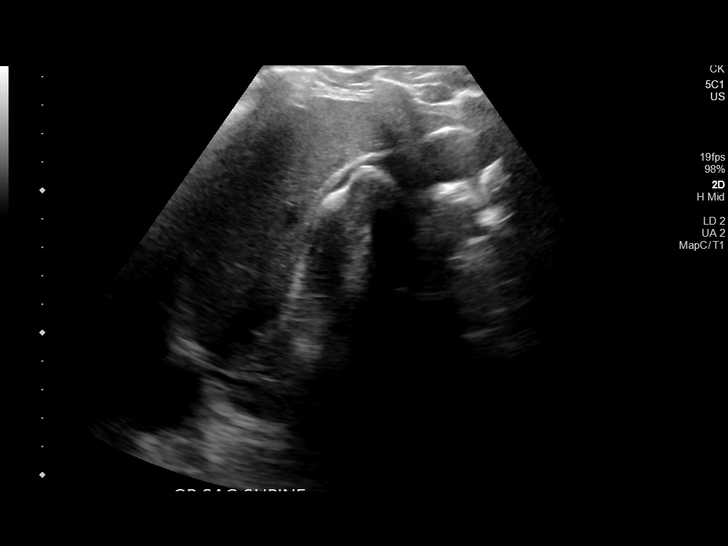
[im 7/75]
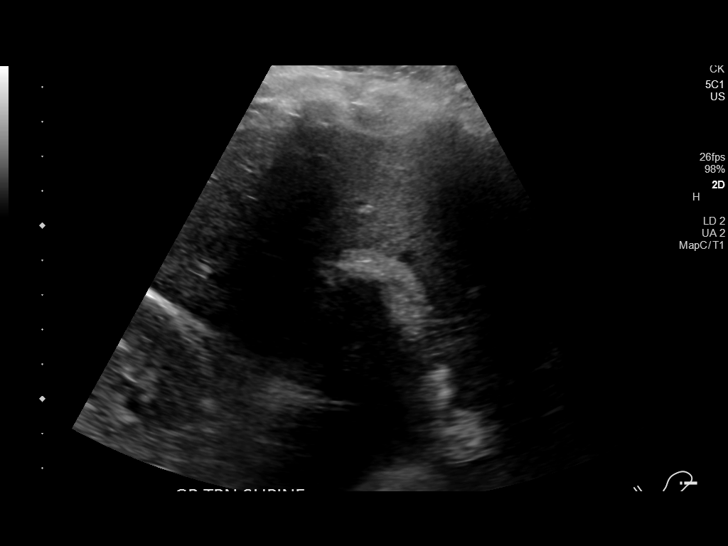
[im 13/75]
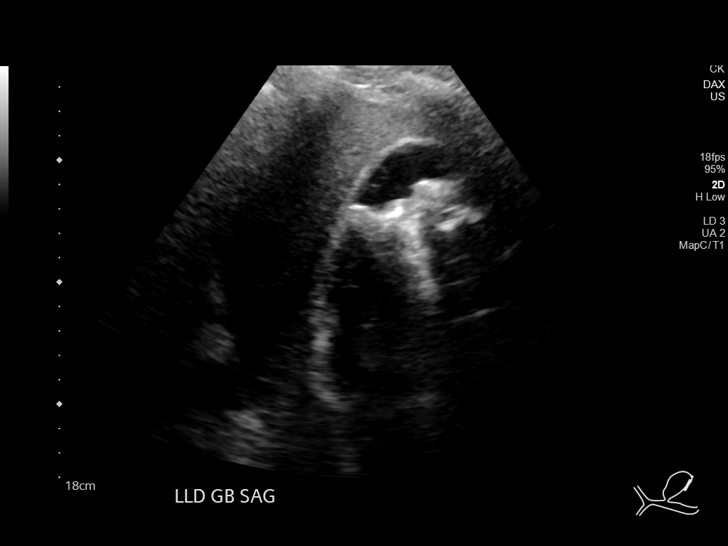
[im 19/75]
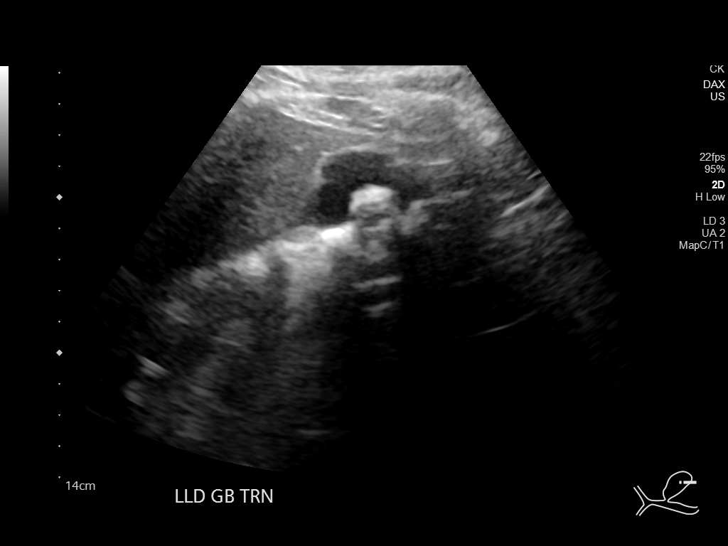
[im 25/75]
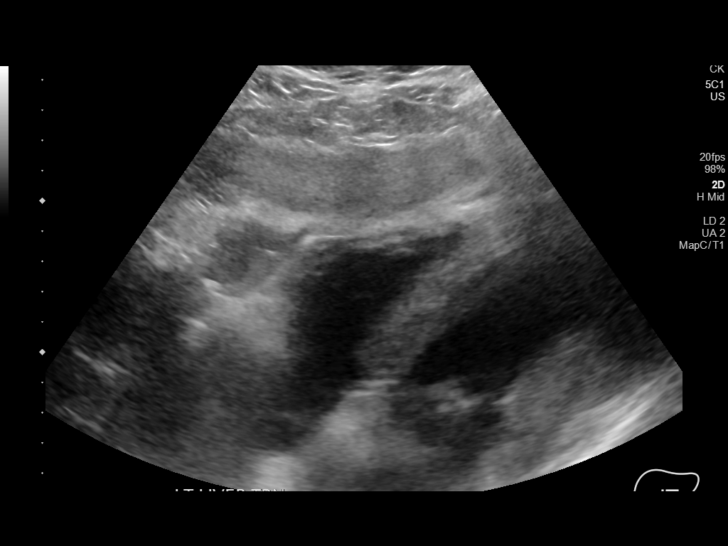
[im 28/75]
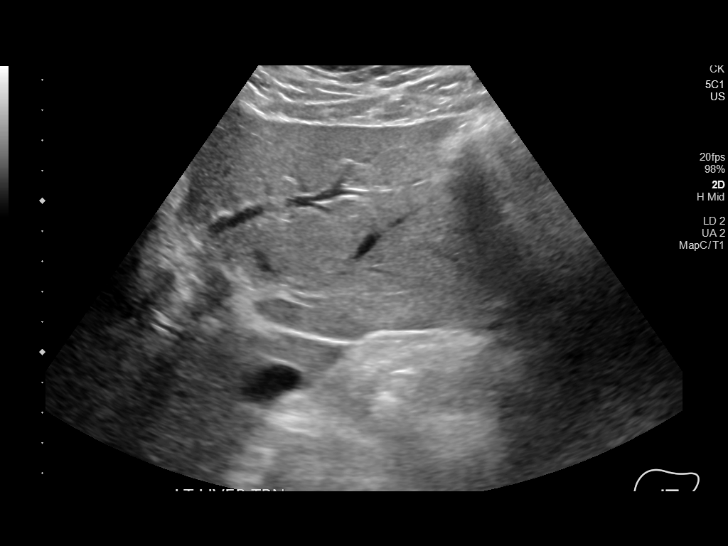
[im 34/75]
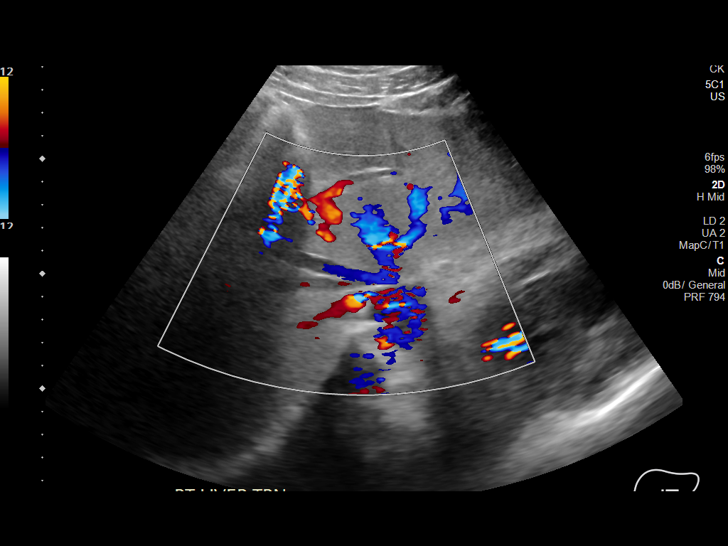
[im 41/75]
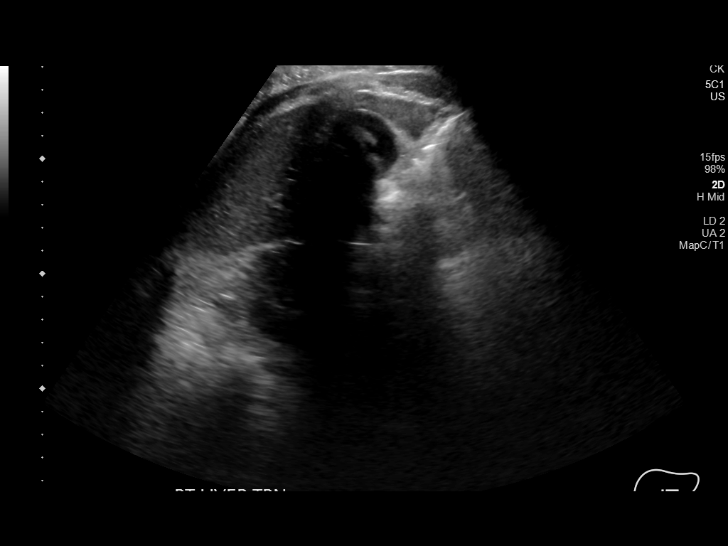
[im 47/75]
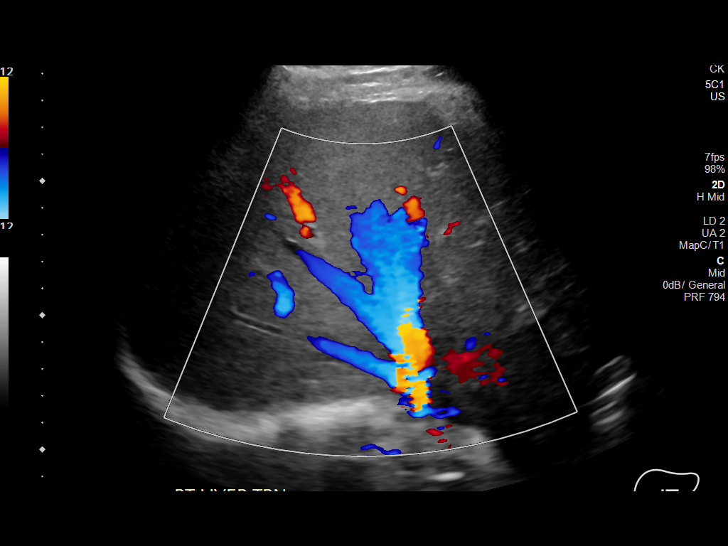
[im 50/75]
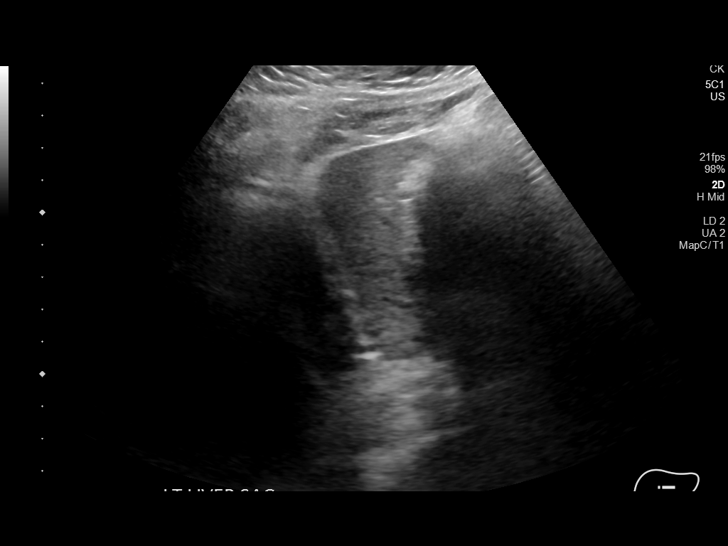
[im 56/75]
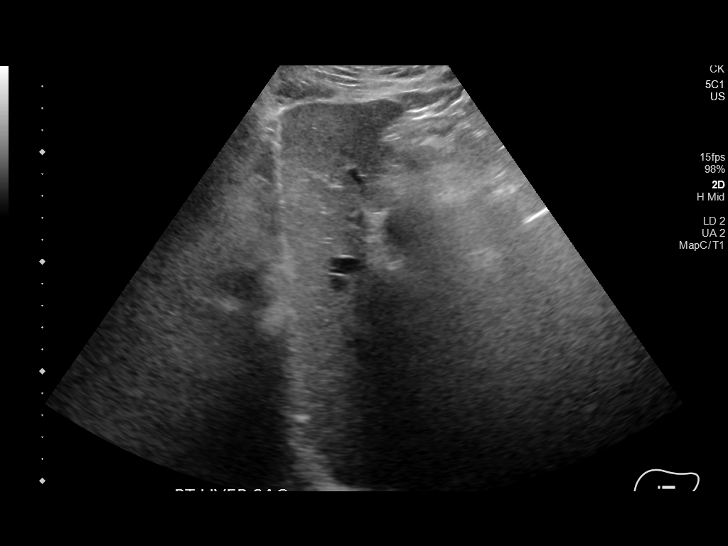
[im 62/75]
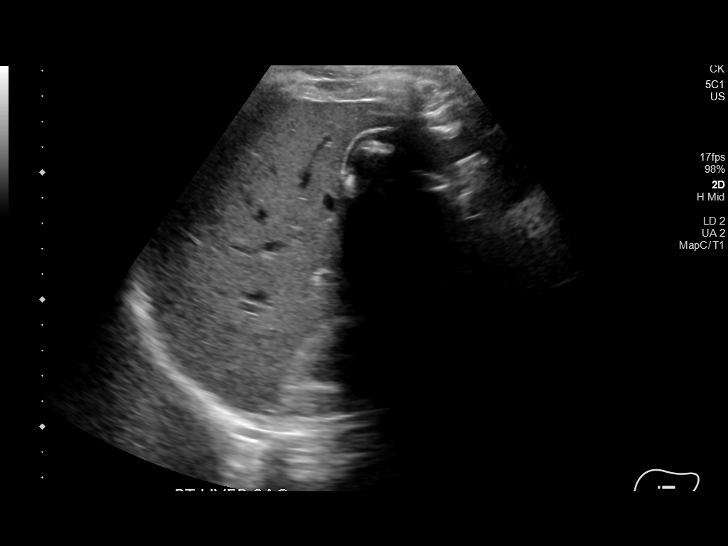
[im 68/75]
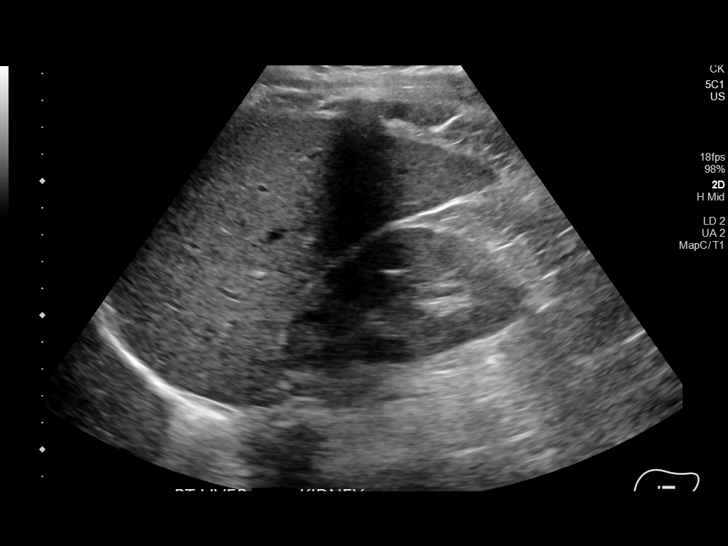
[im 75/75]
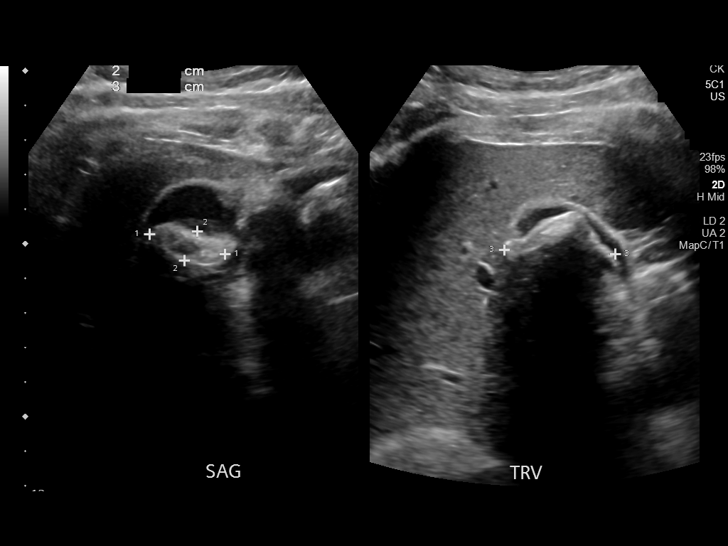

[14 of 25 positions shown; findings below may reference images not displayed]

FINDINGS: Gallbladder:

Multiple large gallstones measuring up to 3.2 cm. Areas partially
obscured by bowel gas. A negative sonographic Murphy sign was
reported by the sonographer. No pericholecystic fluid or wall
thickening.

Common bile duct:

Diameter: 2 mm

Liver:

No focal lesion identified. Within normal limits in parenchymal
echogenicity. Portal vein is patent on color Doppler imaging with
normal direction of blood flow towards the liver.

Other: None.
IMPRESSION: Cholelithiasis without other evidence of acute cholecystitis.

## 2021-02-05 IMAGING — DX DG CHEST 2V
2 series · 2 of 2 positions shown · non-contrast
Comparison: None.

CLINICAL DATA: Syncope

EXAM:
CHEST - 2 VIEW

[chest pa]
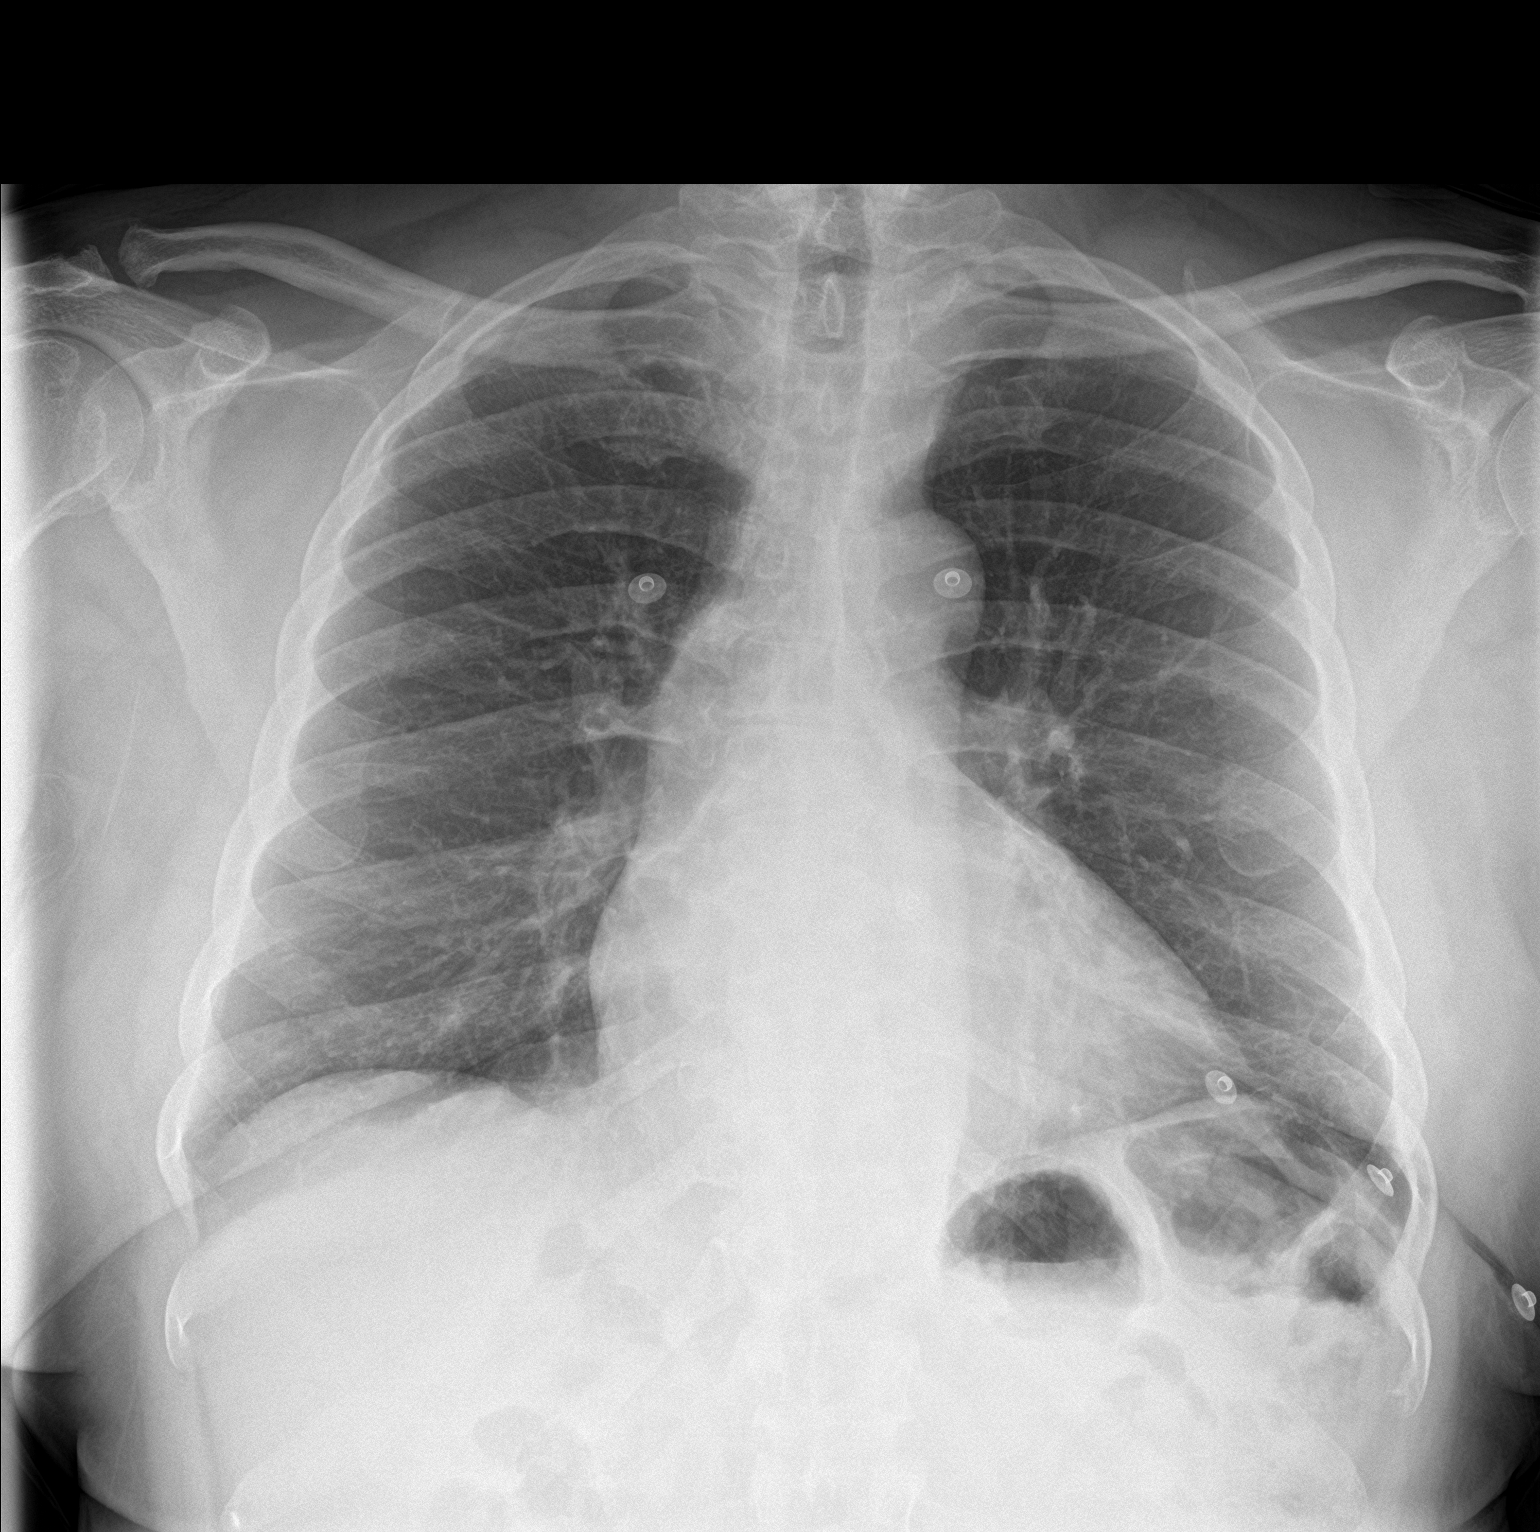

[chest lat]
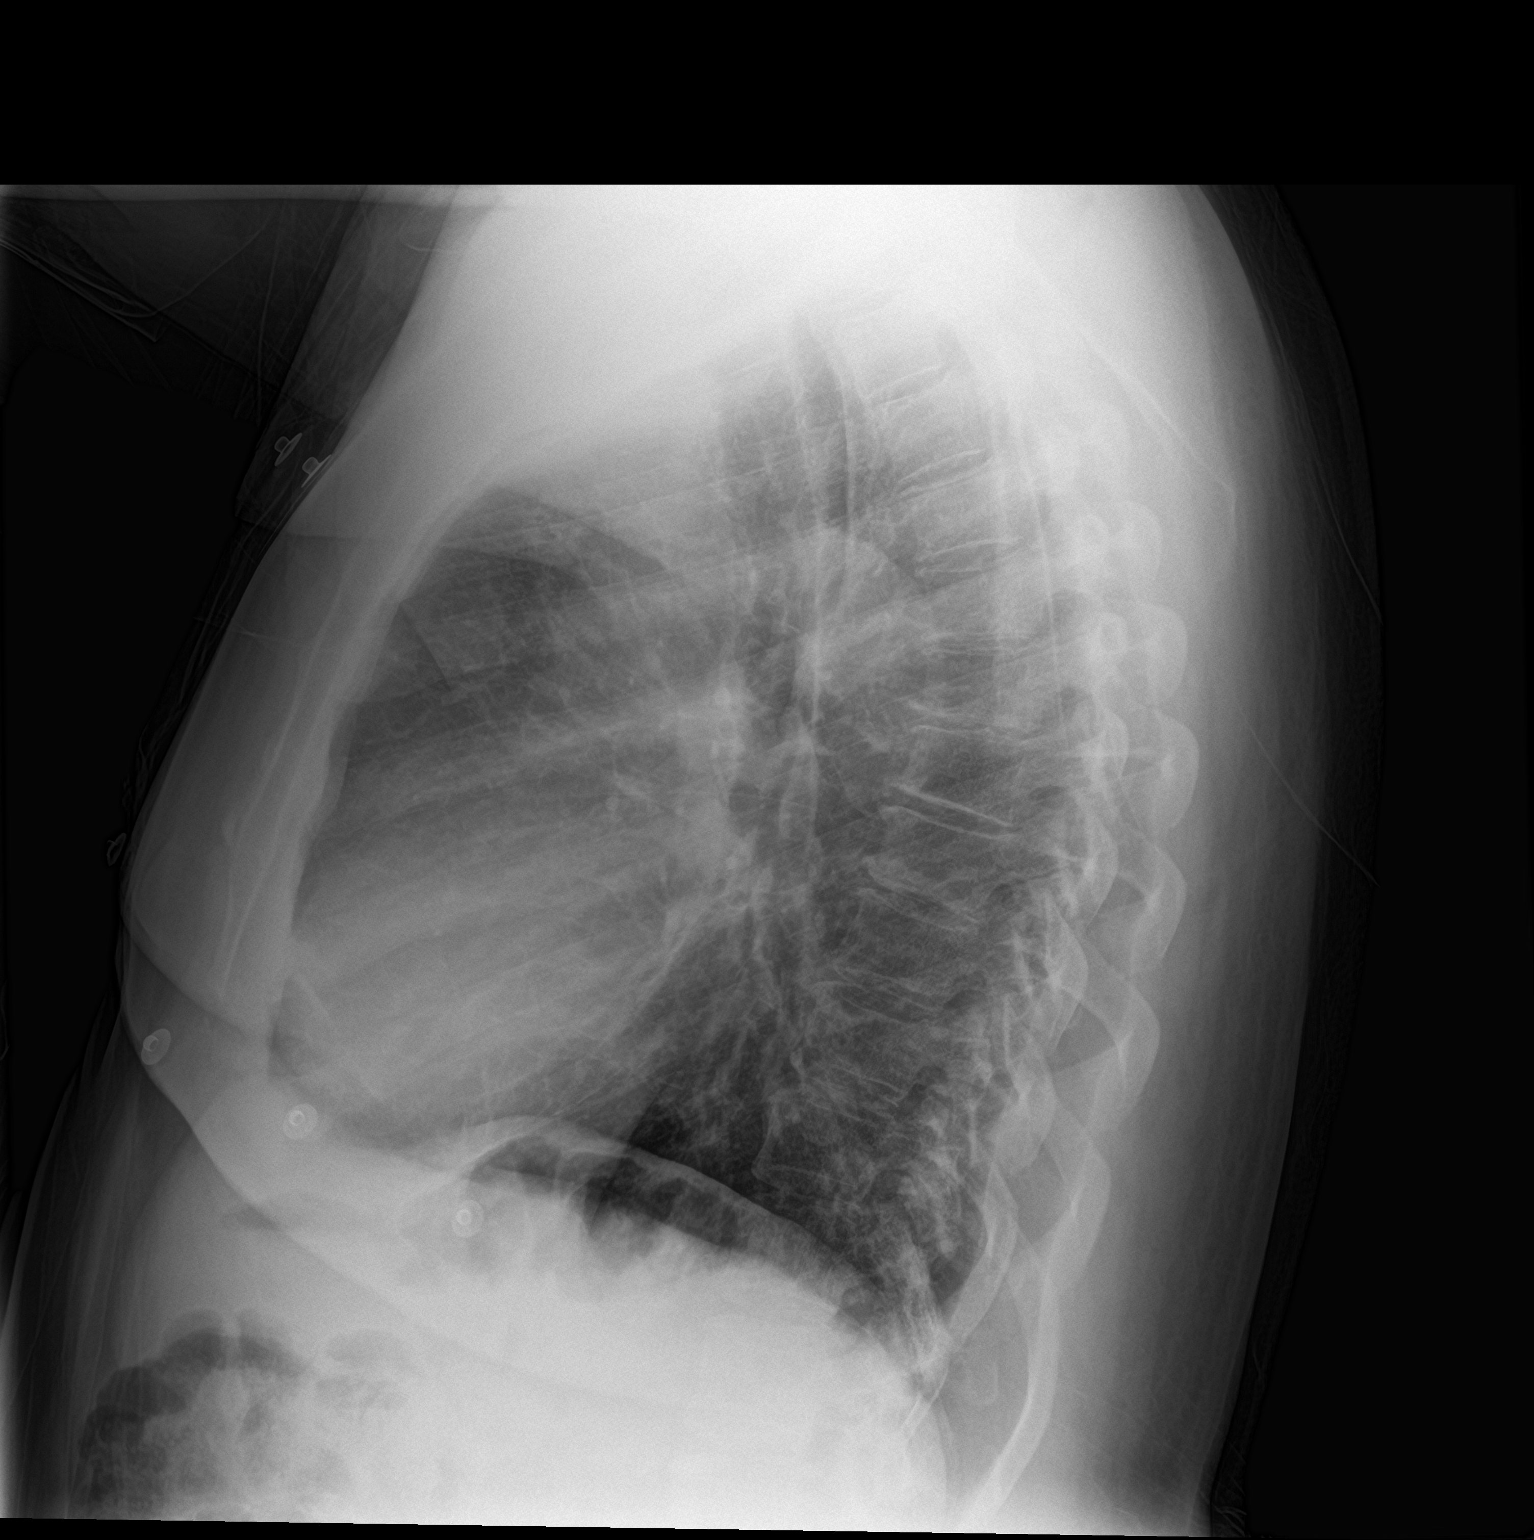

[2 of 2 positions shown; findings below may reference images not displayed]

FINDINGS: No consolidation, features of edema, pneumothorax, or effusion.
Pulmonary vascularity is normally distributed. Coronary artery
calcifications. The cardiomediastinal contours are unremarkable. No
acute osseous or soft tissue abnormality. Degenerative changes are
present in the imaged spine and shoulders.
IMPRESSION: No acute cardiopulmonary abnormality.

## 2023-02-26 ENCOUNTER — Emergency Department (HOSPITAL_COMMUNITY)
Admission: EM | Admit: 2023-02-26 | Discharge: 2023-02-26 | Disposition: A | Discharge status: Home / Self Care | Payer: No Typology Code available for payment source | Attending: Emergency Medicine | Admitting: Emergency Medicine

## 2023-02-26 ENCOUNTER — Other Ambulatory Visit: Payer: Self-pay

## 2023-02-26 ENCOUNTER — Emergency Department (HOSPITAL_COMMUNITY): Payer: No Typology Code available for payment source

## 2023-02-26 ENCOUNTER — Encounter (HOSPITAL_COMMUNITY): Payer: Self-pay

## 2023-02-26 DIAGNOSIS — Z79899 Other long term (current) drug therapy: Secondary | ICD-10-CM | POA: Insufficient documentation

## 2023-02-26 DIAGNOSIS — I10 Essential (primary) hypertension: Secondary | ICD-10-CM | POA: Diagnosis not present

## 2023-02-26 DIAGNOSIS — Z20822 Contact with and (suspected) exposure to covid-19: Secondary | ICD-10-CM | POA: Insufficient documentation

## 2023-02-26 DIAGNOSIS — M542 Cervicalgia: Secondary | ICD-10-CM

## 2023-02-26 DIAGNOSIS — Z7984 Long term (current) use of oral hypoglycemic drugs: Secondary | ICD-10-CM | POA: Insufficient documentation

## 2023-02-26 DIAGNOSIS — E119 Type 2 diabetes mellitus without complications: Secondary | ICD-10-CM | POA: Diagnosis not present

## 2023-02-26 DIAGNOSIS — Z7982 Long term (current) use of aspirin: Secondary | ICD-10-CM | POA: Diagnosis not present

## 2023-02-26 DIAGNOSIS — R519 Headache, unspecified: Secondary | ICD-10-CM

## 2023-02-26 LAB — RESP PANEL BY RT-PCR (RSV, FLU A&B, COVID)  RVPGX2
Influenza A by PCR: NEGATIVE
Influenza B by PCR: NEGATIVE
Resp Syncytial Virus by PCR: NEGATIVE
SARS Coronavirus 2 by RT PCR: NEGATIVE

## 2023-02-26 MED ORDER — CYCLOBENZAPRINE HCL 10 MG PO TABS
5.0000 mg | ORAL_TABLET | Freq: Once | ORAL | Status: AC
  Administered 2023-02-26: 5 mg via ORAL
  Filled 2023-02-26: qty 1

## 2023-02-26 MED ORDER — ACETAMINOPHEN 325 MG PO TABS: 650.0000 mg | ORAL_TABLET | Freq: Four times a day (QID) | ORAL | Status: DC | PRN

## 2023-02-26 MED ORDER — ACETAMINOPHEN 500 MG PO TABS: 1000.0000 mg | ORAL_TABLET | Freq: Four times a day (QID) | ORAL | Status: DC | PRN

## 2023-02-26 MED ORDER — FENTANYL CITRATE PF 50 MCG/ML IJ SOSY
25.0000 ug | PREFILLED_SYRINGE | Freq: Once | INTRAMUSCULAR | Status: AC
  Administered 2023-02-26: 25 ug via INTRAVENOUS
  Filled 2023-02-26: qty 1

## 2023-02-26 MED ORDER — DICLOFENAC EPOLAMINE 1.3 % EX PTCH
1.0000 | MEDICATED_PATCH | Freq: Two times a day (BID) | CUTANEOUS | Status: DC
  Administered 2023-02-26: 1 via TRANSDERMAL
  Filled 2023-02-26: qty 1

## 2023-02-26 MED ORDER — CYCLOBENZAPRINE HCL 10 MG PO TABS: 10.0000 mg | ORAL_TABLET | Freq: Two times a day (BID) | ORAL | 0 refills | Status: AC | PRN

## 2023-02-26 MED ORDER — DICLOFENAC SODIUM 1 % EX GEL: 2.0000 g | Freq: Four times a day (QID) | CUTANEOUS | 1 refills | Status: AC

## 2023-02-26 NOTE — ED Triage Notes (Signed)
Neck pain/ upper back pain ~ 1 week that's worse when moving head around.   Also sts difficulty swallowing that began ~ 1 hour prior to arrival.

## 2023-02-26 NOTE — Discharge Instructions (Addendum)
Your imaging studies were reassuring today.  Please take tylenol, Flexeril for pain.  You can also apply diclofenac cream for symptom relief. I recommend close follow-up with PCP for reevaluation.  Please do not hesitate to return to emergency department if worrisome signs symptoms we discussed become apparent.

## 2023-02-26 NOTE — ED Provider Notes (Signed)
[facility] Provider Note   CSN: [id number] Arrival date & time: 02/26/23  0630     History  Chief Complaint  Patient presents with   Neck Pain    Paul Clements is a 72 y.o. male with a past medical history of diabetes, hypertension presents today for evaluation of headache and neck pain.  Patient reports that he has had intermittent headache in the last 1 week.  Pain is located in the middle back of his head radiating to his neck.  Patient reports he has tried Tylenol, heat packs at home with minimal improvement.  Patient reports this morning he started to have neck pain with associated neck stiffness after having a burger.  States he feels like his throat is closing.   Neck Pain   Past Medical History:  Diagnosis Date   Diabetes mellitus without complication ([facility])    Heart murmur    Hypertension    Tuberculosis    Past Surgical History:  Procedure Laterality Date   HEMORRHOID SURGERY  1994     Home Medications Prior to Admission medications   Medication Sig Start Date End Date Taking? Authorizing Provider  cyclobenzaprine (FLEXERIL) 10 MG tablet Take 1 tablet (10 mg total) by mouth 2 (two) times daily as needed for muscle spasms. 02/26/23  Yes Rex Kras, PA  diclofenac Sodium (VOLTAREN) 1 % GEL Apply 2 g topically 4 (four) times daily. 02/26/23  Yes Rex Kras, PA  amLODipine (NORVASC) 10 MG tablet Take 10 mg by mouth daily.    [provider]  aspirin (ASPIRIN 81) 81 MG chewable tablet Chew 81 mg by mouth daily.    [provider]  carvedilol (COREG) 25 MG tablet Take 12.5 mg by mouth 2 (two) times daily with a meal.    [provider]  metFORMIN (GLUCOPHAGE) 500 MG tablet Take 1,000 mg by mouth 2 (two) times daily with a meal.    [provider]  ondansetron (ZOFRAN ODT) 4 MG disintegrating tablet Take 1 tablet (4 mg total) by mouth every 8 (eight) hours as needed for nausea or vomiting. Patient not  taking: Reported on 06/12/2020 04/12/20   Charlann Lange, PA-C  oxyCODONE-acetaminophen (PERCOCET/ROXICET) 5-325 MG tablet Take 1 tablet by mouth every 4 (four) hours as needed for severe pain. Patient not taking: Reported on 06/12/2020 04/12/20   Charlann Lange, PA-C  Vitamin D, Ergocalciferol, (DRISDOL) 1.25 MG (50000 UNIT) CAPS capsule Take 50,000 Units by mouth every 7 (seven) days.    [provider]  lisinopril (ZESTRIL) 20 MG tablet Take 20 mg by mouth daily.  06/12/20  [provider]      Allergies    Hydroxychloroquine and Metformin    Review of Systems   Review of Systems  Musculoskeletal:  Positive for neck pain.    Physical Exam Updated Vital Signs BP (!) 169/91   Pulse 97   Temp 98.1 F (36.7 C) (Oral)   Resp 18   Ht '5\' 8"'$  (1.727 m)   Wt 97.5 kg   SpO2 97%   BMI 32.69 kg/m  Physical Exam Vitals and nursing note reviewed.  Constitutional:      Appearance: Normal appearance.  HENT:     Head: Normocephalic and atraumatic.     Mouth/Throat:     Mouth: Mucous membranes are moist.  Eyes:     General: No scleral icterus. Cardiovascular:     Rate and Rhythm: Normal rate and regular rhythm.  Pulses: Normal pulses.     Heart sounds: Normal heart sounds.  Pulmonary:     Effort: Pulmonary effort is normal.     Breath sounds: Normal breath sounds.  Abdominal:     General: Abdomen is flat.     Palpations: Abdomen is soft.     Tenderness: There is no abdominal tenderness.  Musculoskeletal:        General: No deformity.     Cervical back: Rigidity and tenderness present.  Skin:    General: Skin is warm.     Findings: No rash.  Neurological:     General: No focal deficit present.     Mental Status: He is alert.     Comments: Cranial nerves II through XII intact. Intact sensation to light touch in all 4 extremities. 5/5 strength in all 4 extremities. Intact finger-to-nose and heel-to-shin of all 4 extremities. No visual field cuts. No neglect  noted. No aphasia noted.  Psychiatric:        Mood and Affect: Mood normal.     ED Results / Procedures / Treatments   Labs (all labs ordered are listed, but only abnormal results are displayed) Labs Reviewed  RESP PANEL BY RT-PCR (RSV, FLU A&B, COVID)  [id number]    EKG None  Radiology CT Head Wo Contrast  Result Date: 02/26/2023 CLINICAL DATA:  Headache with neck pain. EXAM: CT HEAD WITHOUT CONTRAST CT CERVICAL SPINE WITHOUT CONTRAST TECHNIQUE: Multidetector CT imaging of the head and cervical spine was performed following the standard protocol without intravenous contrast. Multiplanar CT image reconstructions of the cervical spine were also generated. RADIATION DOSE REDUCTION: This exam was performed according to the departmental dose-optimization program which includes automated exposure control, adjustment of the mA and/or kV according to patient size and/or use of iterative reconstruction technique. COMPARISON:  None Available. FINDINGS: CT HEAD FINDINGS Brain: There is no evidence for acute hemorrhage, hydrocephalus, mass lesion, or abnormal extra-axial fluid collection. No definite CT evidence for acute infarction. Patchy low attenuation in the deep hemispheric and periventricular white matter is nonspecific, but likely reflects chronic microvascular ischemic demyelination. Vascular: No hyperdense vessel or unexpected calcification. Skull: No evidence for fracture. No worrisome lytic or sclerotic lesion. Sinuses/Orbits: The visualized paranasal sinuses and mastoid air cells are clear. Visualized portions of the globes and intraorbital fat are unremarkable. Other: None. CT CERVICAL SPINE FINDINGS Alignment: Normal. Skull base and vertebrae: No acute fracture. No primary bone lesion or focal pathologic process. Soft tissues and spinal canal: No prevertebral fluid or swelling. No visible canal hematoma. Disc levels: Loss of disc height with endplate degeneration noted C6-7. Upper chest:  Unremarkable. Other: None. IMPRESSION: 1. No acute intracranial abnormality. 2. Chronic small vessel ischemic disease. 3. No cervical spine fracture or subluxation. 4. Degenerative changes at C6-7. Electronically Signed   By: Misty Stanley M.D.   On: 02/26/2023 09:04   CT Cervical Spine Wo Contrast  Result Date: 02/26/2023 CLINICAL DATA:  Headache with neck pain. EXAM: CT HEAD WITHOUT CONTRAST CT CERVICAL SPINE WITHOUT CONTRAST TECHNIQUE: Multidetector CT imaging of the head and cervical spine was performed following the standard protocol without intravenous contrast. Multiplanar CT image reconstructions of the cervical spine were also generated. RADIATION DOSE REDUCTION: This exam was performed according to the departmental dose-optimization program which includes automated exposure control, adjustment of the mA and/or kV according to patient size and/or use of iterative reconstruction technique. COMPARISON:  None Available. FINDINGS: CT HEAD FINDINGS Brain: There is no evidence for acute hemorrhage,  hydrocephalus, mass lesion, or abnormal extra-axial fluid collection. No definite CT evidence for acute infarction. Patchy low attenuation in the deep hemispheric and periventricular white matter is nonspecific, but likely reflects chronic microvascular ischemic demyelination. Vascular: No hyperdense vessel or unexpected calcification. Skull: No evidence for fracture. No worrisome lytic or sclerotic lesion. Sinuses/Orbits: The visualized paranasal sinuses and mastoid air cells are clear. Visualized portions of the globes and intraorbital fat are unremarkable. Other: None. CT CERVICAL SPINE FINDINGS Alignment: Normal. Skull base and vertebrae: No acute fracture. No primary bone lesion or focal pathologic process. Soft tissues and spinal canal: No prevertebral fluid or swelling. No visible canal hematoma. Disc levels: Loss of disc height with endplate degeneration noted C6-7. Upper chest: Unremarkable. Other: None.  IMPRESSION: 1. No acute intracranial abnormality. 2. Chronic small vessel ischemic disease. 3. No cervical spine fracture or subluxation. 4. Degenerative changes at C6-7. Electronically Signed   By: Misty Stanley M.D.   On: 02/26/2023 09:04    Procedures Procedures    Medications Ordered in ED Medications  diclofenac (FLECTOR) 1.3 % 1 patch (has no administration in time range)  cyclobenzaprine (FLEXERIL) tablet 5 mg (5 mg Oral Given 02/26/23 0801)  fentaNYL (SUBLIMAZE) injection 25 mcg (25 mcg Intravenous Given 02/26/23 0800)    ED Course/ Medical Decision Making/ A&P                             Medical Decision Making Amount and/or Complexity of Data Reviewed Radiology: ordered.  Risk OTC drugs. Prescription drug management.   This patient presents to the ED for headache, this involves an extensive number of treatment options, and is a complaint that carries with a high risk of complications and morbidity.  The differential diagnosis includes migraines, tension type headache, ICH, fracture, dislocation, cervical radiculopathy, neuropathy, musculoskeletal pain.  This is not an exhaustive list.  Lab tests: I ordered and personally interpreted labs.  The pertinent results include: Viral panel negative.  Imaging studies: I ordered imaging studies. I personally reviewed, interpreted imaging and agree with the radiologist's interpretations. The results include: CT head and CT cervical spine negative.  Problem list/ ED course/ Critical interventions/ Medical management: HPI: See above Vital signs within normal range and stable throughout visit. Laboratory/imaging studies significant for: See above. On physical examination, patient is afebrile and appears in no acute distress. This patient presents with a headache most consistent with benign headache from possibly tension type headache. No headache red flags. Neurologic exam without evidence of meningismus, AMS, focal neurologic findings  so doubt meningitis, encephalitis, stroke. Presentation not consistent with acute intracranial bleed to include SAH (lack of risk factors, headache history, negative CT scan). No history of trauma so doubt ICH. Doubt carotid artery dissection given no focal neuro deficits, no neck trauma or recent neck strain. Patient with no signs of increased intracranial pressure or weight loss and history and physical suggest more benign headache so less likely mass effect in brain from tumor or abscess or idiopathic intracranial hypertension.  Unclear etiology of neck stiffness, possibly torticollis.  Pain was controlled with fentanyl, Flexeril and diclofenac patch and patient discharged home with PCP follow up. I have reviewed the patient home medicines and have made adjustments as needed.  Cardiac monitoring/EKG: The patient was maintained on a cardiac monitor.  I personally reviewed and interpreted the cardiac monitor which showed an underlying rhythm of: sinus rhythm.  Additional history obtained: External records from outside source obtained  and reviewed including: Chart review including previous notes, labs, imaging.  Disposition Continued outpatient therapy. Follow-up with PCP recommended for reevaluation of symptoms. Treatment plan discussed with patient.  Pt acknowledged understanding was agreeable to the plan. Worrisome signs and symptoms were discussed with patient, and patient acknowledged understanding to return to the ED if they noticed these signs and symptoms. Patient was stable upon discharge.   This chart was dictated using voice recognition software.  Despite best efforts to proofread,  errors can occur which can change the documentation meaning.          Final Clinical Impression(s) / ED Diagnoses Final diagnoses:  Neck pain  Nonintractable headache, unspecified chronicity pattern, unspecified headache type    Rx / DC Orders ED Discharge Orders          Ordered     cyclobenzaprine (FLEXERIL) 10 MG tablet  2 times daily PRN        02/26/23 1109    diclofenac Sodium (VOLTAREN) 1 % GEL  4 times daily        02/26/23 1109              Rex Kras, [place] 02/26/23 1111    Carmin Muskrat, MD 02/28/23 [phone]
